# Patient Record
Sex: Male | Born: 1985 | Race: Black or African American | Hispanic: No | Marital: Single | Smoking: Never smoker
Health system: Southern US, Community
[De-identification: ages and names within clinical notes are randomized; demographics above are authoritative.]

## PROBLEM LIST (undated history)

## (undated) HISTORY — PX: APPENDECTOMY: SHX54

---

## 2005-06-02 ENCOUNTER — Encounter: Admission: RE | Admit: 2005-06-02 | Discharge: 2005-06-02 | Payer: Self-pay | Admitting: Emergency Medicine

## 2009-02-08 ENCOUNTER — Emergency Department (HOSPITAL_COMMUNITY): Admission: EM | Admit: 2009-02-08 | Discharge: 2009-02-08 | Payer: Self-pay | Admitting: Family Medicine

## 2009-04-27 ENCOUNTER — Emergency Department (HOSPITAL_COMMUNITY): Admission: EM | Admit: 2009-04-27 | Discharge: 2009-04-27 | Payer: Self-pay | Admitting: Emergency Medicine

## 2009-04-28 ENCOUNTER — Encounter (INDEPENDENT_AMBULATORY_CARE_PROVIDER_SITE_OTHER): Payer: Self-pay | Admitting: Surgery

## 2009-04-28 ENCOUNTER — Inpatient Hospital Stay (HOSPITAL_COMMUNITY): Admission: EM | Admit: 2009-04-28 | Discharge: 2009-05-08 | Payer: Self-pay | Admitting: Emergency Medicine

## 2010-05-13 LAB — BASIC METABOLIC PANEL
BUN: 4 mg/dL — ABNORMAL LOW (ref 6–23)
BUN: 9 mg/dL (ref 6–23)
CO2: 33 mEq/L — ABNORMAL HIGH (ref 19–32)
Chloride: 95 mEq/L — ABNORMAL LOW (ref 96–112)
Chloride: 98 mEq/L (ref 96–112)
GFR calc Af Amer: >60 mL/min (ref >60)
GFR calc Af Amer: >60 mL/min (ref >60)
GFR calc Af Amer: >60 mL/min (ref >60)
GFR calc Af Amer: >60 mL/min (ref >60)
GFR calc Af Amer: >60 mL/min (ref >60)
GFR calc non Af Amer: >60 mL/min (ref >60)
GFR calc non Af Amer: >60 mL/min (ref >60)
GFR calc non Af Amer: >60 mL/min (ref >60)
Glucose, Bld: 106 mg/dL — ABNORMAL HIGH (ref 70–99)
Glucose, Bld: 114 mg/dL — ABNORMAL HIGH (ref 70–99)
Glucose, Bld: 98 mg/dL (ref 70–99)
Potassium: 3.3 mEq/L — ABNORMAL LOW (ref 3.5–5.1)
Potassium: 4.6 mEq/L (ref 3.5–5.1)
Sodium: 130 mEq/L — ABNORMAL LOW (ref 135–145)
Sodium: 130 mEq/L — ABNORMAL LOW (ref 135–145)
Sodium: 134 mEq/L — ABNORMAL LOW (ref 135–145)
Sodium: 134 mEq/L — ABNORMAL LOW (ref 135–145)

## 2010-05-13 LAB — COMPREHENSIVE METABOLIC PANEL
ALT: 20 U/L (ref 0–53)
AST: 30 U/L (ref 0–37)
Albumin: 2.6 g/dL — ABNORMAL LOW (ref 3.5–5.2)
Alkaline Phosphatase: 60 U/L (ref 39–117)
Alkaline Phosphatase: 70 U/L (ref 39–117)
BUN: 15 mg/dL (ref 6–23)
BUN: 7 mg/dL (ref 6–23)
CO2: 24 mEq/L (ref 19–32)
Calcium: 9.1 mg/dL (ref 8.4–10.5)
Chloride: 92 mEq/L — ABNORMAL LOW (ref 96–112)
Chloride: 96 mEq/L (ref 96–112)
Creatinine, Ser: 1.72 mg/dL — ABNORMAL HIGH (ref 0.4–1.5)
GFR calc non Af Amer: 50 mL/min — ABNORMAL LOW (ref >60)
GFR calc non Af Amer: >60 mL/min (ref >60)
Glucose, Bld: 183 mg/dL — ABNORMAL HIGH (ref 70–99)
Sodium: 132 mEq/L — ABNORMAL LOW (ref 135–145)
Total Bilirubin: 2.8 mg/dL — ABNORMAL HIGH (ref 0.3–1.2)
Total Bilirubin: 4.4 mg/dL — ABNORMAL HIGH (ref 0.3–1.2)
Total Protein: 7.9 g/dL (ref 6.0–8.3)

## 2010-05-13 LAB — DIFFERENTIAL
Basophils Absolute: 0.1 10*3/uL (ref 0.0–0.1)
Basophils Relative: 0 % (ref 0–1)
Eosinophils Absolute: 0 10*3/uL (ref 0.0–0.7)
Eosinophils Relative: 0 % (ref 0–5)
Lymphs Abs: 0.7 10*3/uL (ref 0.7–4.0)
Lymphs Abs: 1 10*3/uL (ref 0.7–4.0)
Monocytes Relative: 7 % (ref 3–12)

## 2010-05-13 LAB — URINALYSIS, ROUTINE W REFLEX MICROSCOPIC
Glucose, UA: NEGATIVE mg/dL
Hgb urine dipstick: NEGATIVE
Nitrite: NEGATIVE

## 2010-05-13 LAB — CBC
HCT: 34.3 % — ABNORMAL LOW (ref 39.0–52.0)
HCT: 37.2 % — ABNORMAL LOW (ref 39.0–52.0)
HCT: 39 % (ref 39.0–52.0)
HCT: 40.3 % (ref 39.0–52.0)
Hemoglobin: 11.1 g/dL — ABNORMAL LOW (ref 13.0–17.0)
Hemoglobin: 12.7 g/dL — ABNORMAL LOW (ref 13.0–17.0)
MCHC: 31.3 g/dL (ref 30.0–36.0)
MCHC: 32.4 g/dL (ref 30.0–36.0)
MCV: 73.4 fL — ABNORMAL LOW (ref 78.0–100.0)
MCV: 73.6 fL — ABNORMAL LOW (ref 78.0–100.0)
Platelets: 248 10*3/uL (ref 150–400)
Platelets: 414 10*3/uL — ABNORMAL HIGH (ref 150–400)
RBC: 4.67 MIL/uL (ref 4.22–5.81)
RBC: 4.84 MIL/uL (ref 4.22–5.81)
RBC: 5.02 MIL/uL (ref 4.22–5.81)
RBC: 5.08 MIL/uL (ref 4.22–5.81)
RBC: 5.33 MIL/uL (ref 4.22–5.81)
RDW: 13.7 % (ref 11.5–15.5)
RDW: 13.8 % (ref 11.5–15.5)
RDW: 13.8 % (ref 11.5–15.5)
RDW: 14 % (ref 11.5–15.5)
WBC: 7.4 10*3/uL (ref 4.0–10.5)
WBC: 8 10*3/uL (ref 4.0–10.5)

## 2010-05-13 LAB — ANAEROBIC CULTURE

## 2010-05-13 LAB — BODY FLUID CULTURE

## 2010-12-04 IMAGING — CT CT ABD-PELV W/ CM
2 of 4 series · 14 of 32 positions shown, 19 images · IV contrast (agent unspecified)
Comparison: 04/28/2009

CLINICAL DATA: Diffuse abdominal pain and nausea vomiting.  Postop
appendectomy.

CT ABDOMEN AND PELVIS WITH CONTRAST
TECHNIQUE: Multidetector CT imaging of the abdomen and pelvis was
performed following the standard protocol during bolus
administration of intravenous contrast.
Contrast: 800 ml Amnipaque-COO and oral contrast

[Series 2: routine abdomen · axial · 0.74mm/px · z∈[-397,-2]mm · 8 of 103 slices shown, 13 images]
[im 12/103  soft-tissue]
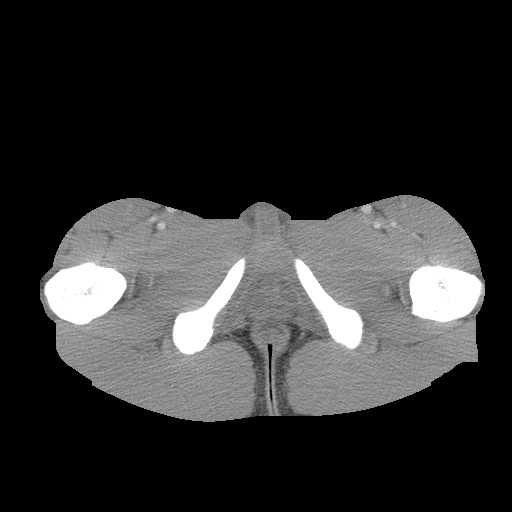
[im 12/103  bone]
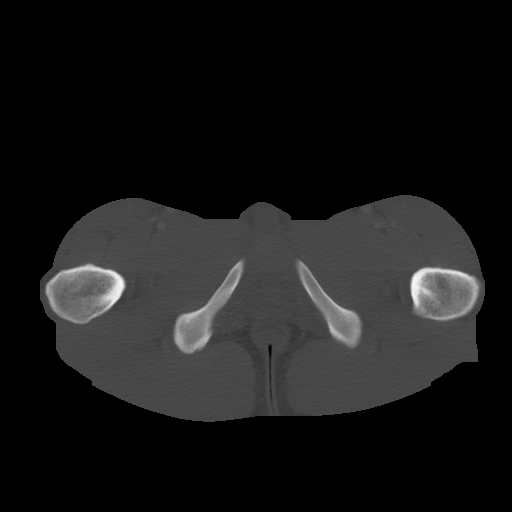
[im 23/103  soft-tissue]
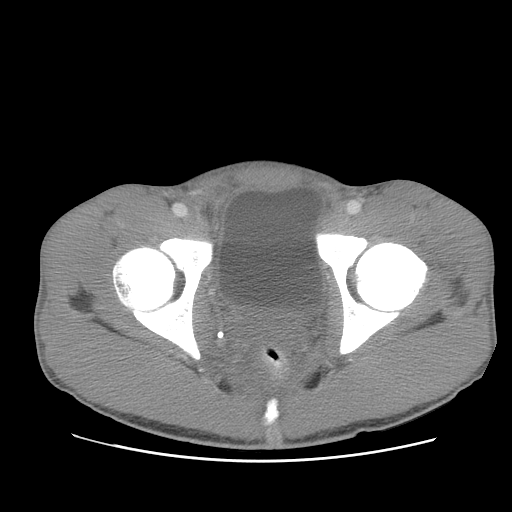
[im 35/103  soft-tissue]
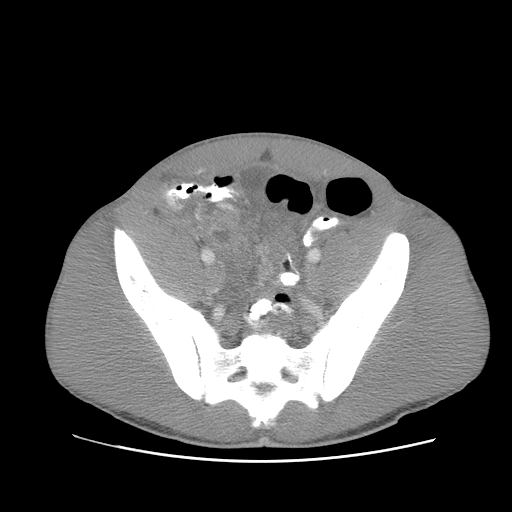
[im 46/103  soft-tissue]
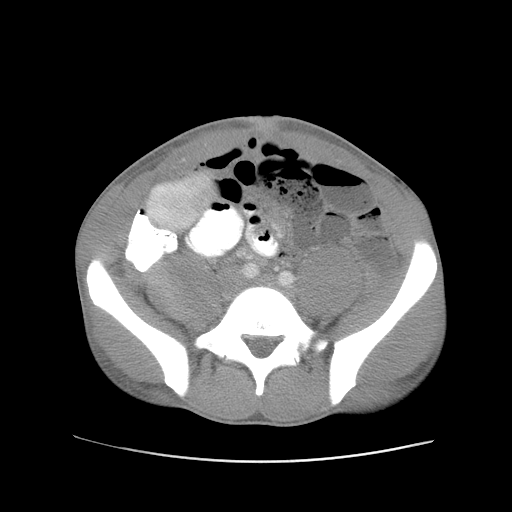
[im 57/103  soft-tissue]
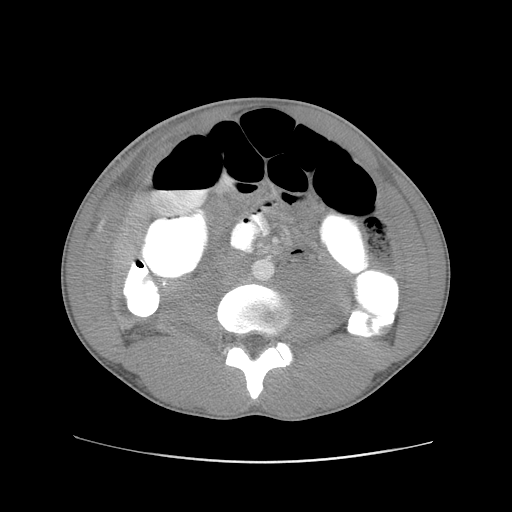
[im 57/103  lung]
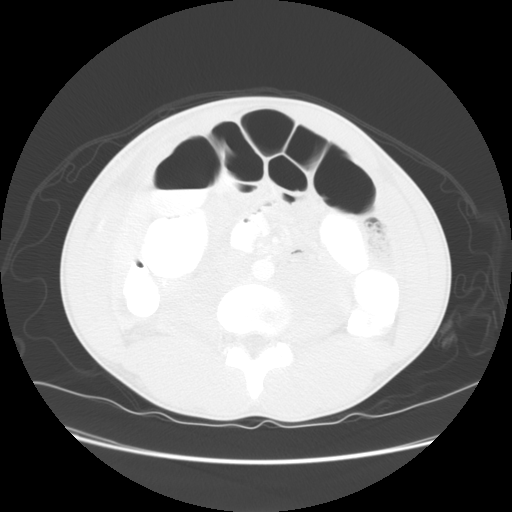
[im 69/103  soft-tissue]
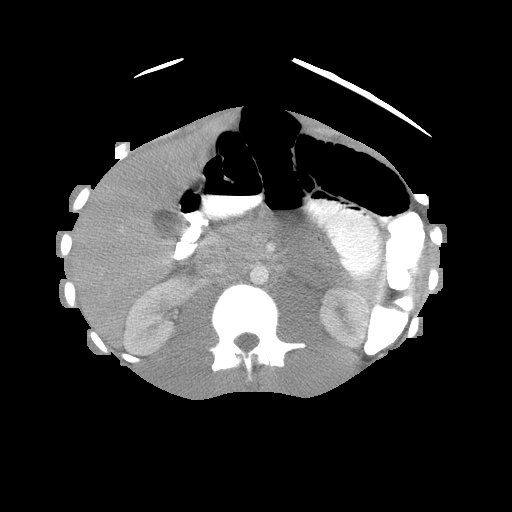
[im 69/103  lung]
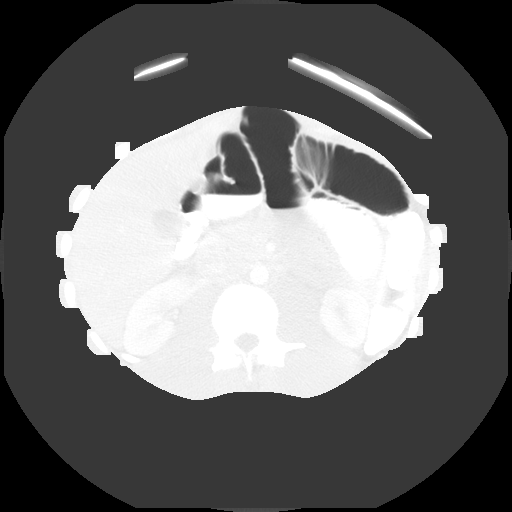
[im 80/103  soft-tissue]
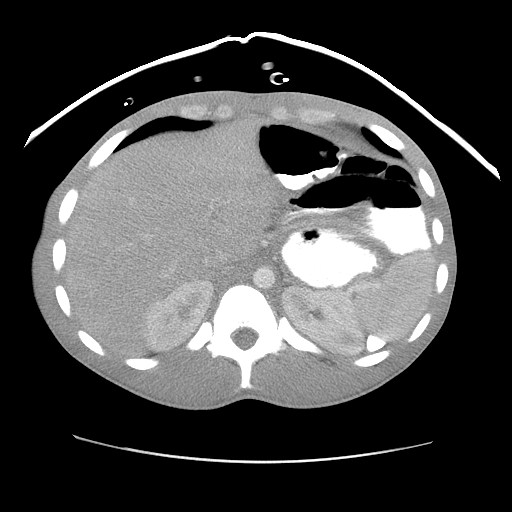
[im 80/103  lung]
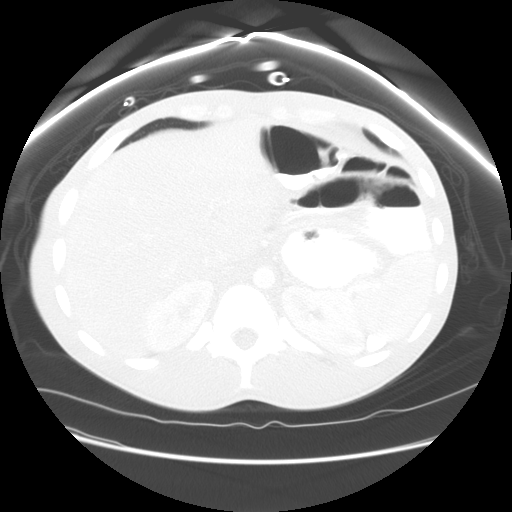
[im 91/103  soft-tissue]
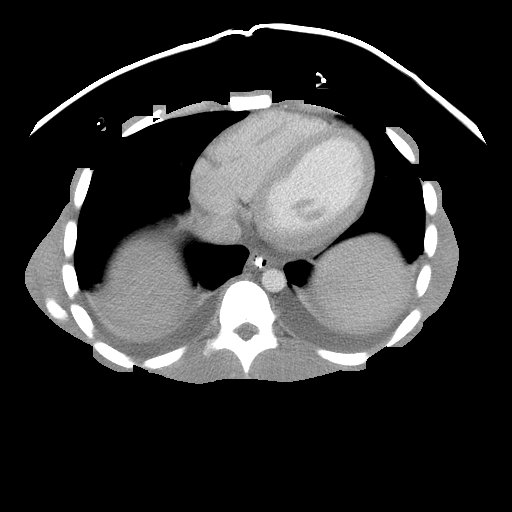
[im 91/103  lung]
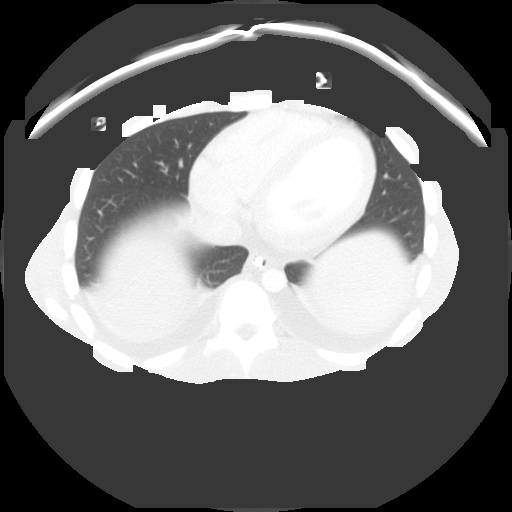

[Series 400: sag · sagittal · 1.02mm/px · 6 of 107 slices shown]
[im 11/107  soft-tissue]
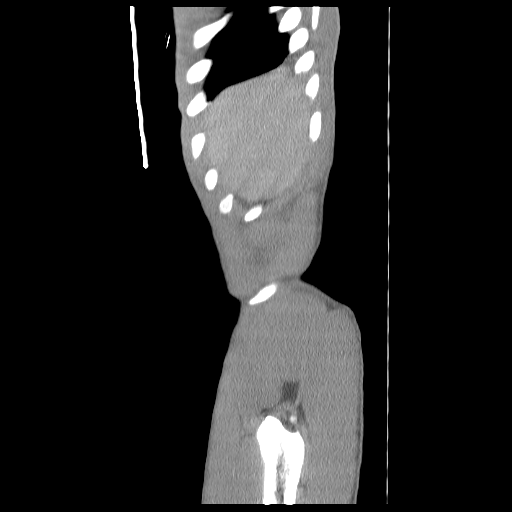
[im 22/107  soft-tissue]
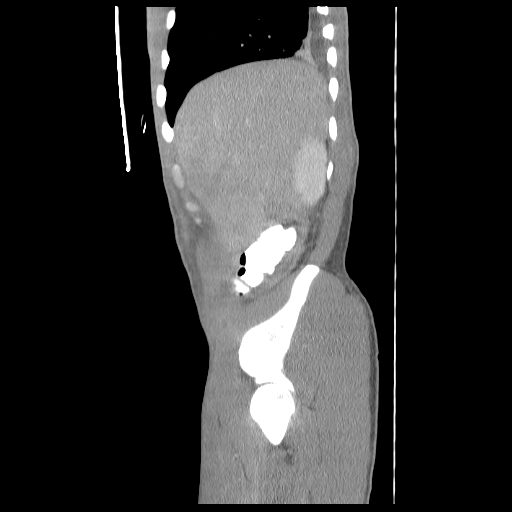
[im 32/107  soft-tissue]
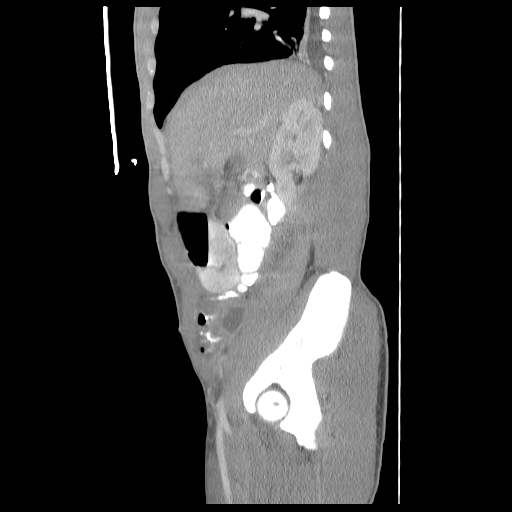
[im 43/107  soft-tissue]
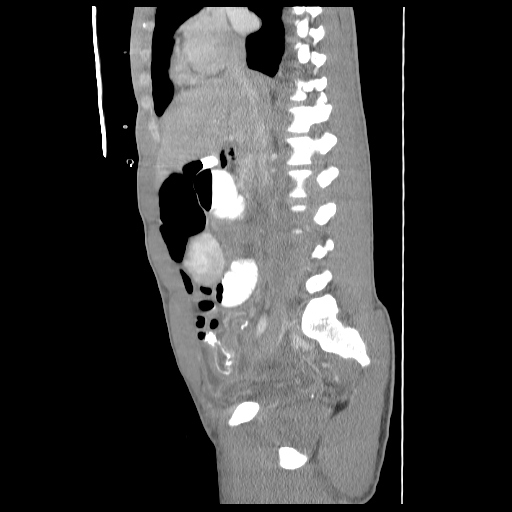
[im 64/107  soft-tissue]
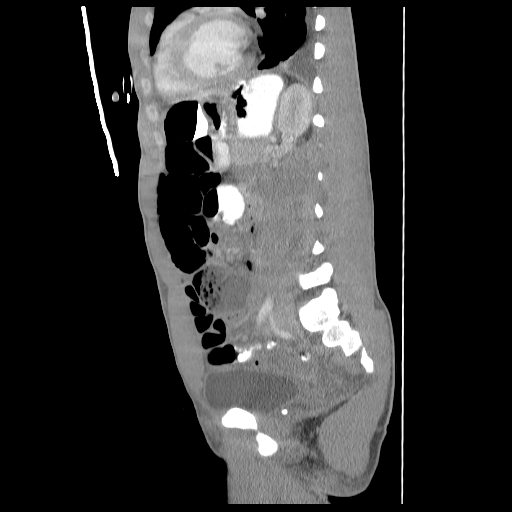
[im 75/107  soft-tissue]
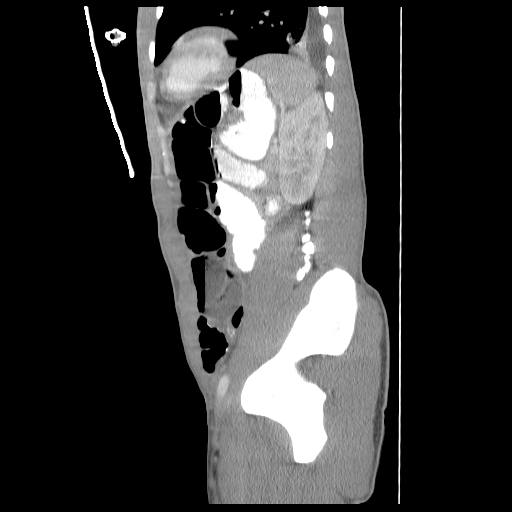

[14 of 32 positions shown; findings below may reference images not displayed]

FINDINGS: New small bilateral pleural effusions are seen.  The
patient has undergone appendectomy since prior study.  Several
small rim enhancing fluid collections are seen in the right lower
quadrant and pelvic cul-de-sac.  The largest of these is located in
the pelvic cul-de-sac and measures 2.4 x 5.0 cm.  This is mildly
decreased since prior study.

Multiple other smaller fluid collections are seen in the right
lower quadrant right pelvis measuring between 1.4 and 3.8 cm in
maximum diameter, also consistent with small abscess sees.  Diffuse
dilatation of bowel loops is again seen consistent with ileus.

The abdominal parenchymal organs are unremarkable in appearance.
No evidence of hydronephrosis.
IMPRESSION: 1.  Multiple small rim enhancing fluid collections in the right
pelvis and pelvic cul-de-sac, consistent with abscesses.  Largest
in the pelvic cul-de-sac measures 5 cm.
2.  Moderate ileus.

## 2014-03-03 ENCOUNTER — Emergency Department (INDEPENDENT_AMBULATORY_CARE_PROVIDER_SITE_OTHER): Payer: BLUE CROSS/BLUE SHIELD

## 2014-03-03 ENCOUNTER — Encounter (HOSPITAL_COMMUNITY): Payer: Self-pay

## 2014-03-03 ENCOUNTER — Emergency Department (HOSPITAL_COMMUNITY)
Admission: EM | Admit: 2014-03-03 | Discharge: 2014-03-03 | Disposition: A | Discharge status: Home / Self Care | Payer: BLUE CROSS/BLUE SHIELD | Source: Home / Self Care | Attending: Emergency Medicine | Admitting: Emergency Medicine

## 2014-03-03 DIAGNOSIS — T1490 Injury, unspecified: Secondary | ICD-10-CM

## 2014-03-03 DIAGNOSIS — S93602A Unspecified sprain of left foot, initial encounter: Secondary | ICD-10-CM

## 2014-03-03 DIAGNOSIS — T1490XA Injury, unspecified, initial encounter: Secondary | ICD-10-CM

## 2014-03-03 NOTE — Discharge Instructions (Signed)
For pain control, you may take the following:  Tylenol 325 mg tablets --2 tablets plus Ibuprofen 200 mg--4 tablets both taken every 8 hours with a meal or snack.   Cryotherapy Cryotherapy is when you put ice on your injury. Ice helps lessen pain and puffiness (swelling) after an injury. Ice works the best when you start using it in the first 24 to 48 hours after an injury. HOME CARE 1. Put a dry or damp towel between the ice pack and your skin. 2. You may press gently on the ice pack. 3. Leave the ice on for no more than 10 to 20 minutes at a time. 4. Check your skin after 5 minutes to make sure your skin is okay. 5. Rest at least 20 minutes between ice pack uses. 6. Stop using ice when your skin loses feeling (numbness). 7. Do not use ice on someone who cannot tell you when it hurts. This includes small children and people with memory problems (dementia). GET HELP RIGHT AWAY IF:  You have white spots on your skin.  Your skin turns blue or pale.  Your skin feels waxy or hard.  Your puffiness gets worse. MAKE SURE YOU:   Understand these instructions.  Will watch your condition.  Will get help right away if you are not doing well or get worse. Document Released: 07/23/2007 Document Revised: 04/28/2011 Document Reviewed: 09/26/2010 [facility] Patient Information 2015 [place], [place]. This information is not intended to replace advice given to you by your health care provider. Make sure you discuss any questions you have with your health care provider.

## 2014-03-03 NOTE — ED Notes (Signed)
States he had an injury to left foot 1 week ago while playing basketball . Pain mostly left 5th metatarsal, proximal aspect. Has been walking on the foot, wrapping w ACE

## 2014-03-03 NOTE — ED Provider Notes (Signed)
   Chief Complaint   Foot Injury   History of Present Illness   Gene Davis is a 29 year old male who was playing basketball week ago when he came down wrong on his left foot. He heard something pop, there is since then he's had pain over the last 3 metatarsals of the foot. There is slight swelling. It hurts to ambulate. He denies any pain over the first or second metatarsals, the ankle, the lower leg.  Review of Systems   Other than as noted above, the patient denies any of the following symptoms: Systemic:  No fevers or chills. Musculoskeletal:  No joint pain or arthritis.  Neurological:  No muscular weakness, paresthesias.   PMFSH   Past medical history, family history, social history, meds, and allergies were reviewed.     Physical  Examination     Vital signs:  BP 127/74 mmHg  Pulse 50  Temp(Src) 98.7 F (37.1 C) (Oral)  Resp 14  SpO2 100% Gen:  Alert and oriented times 3.  In no distress. Musculoskeletal:  Exam of the foot reveals there is mild pain to palpation over the third, fourth, and fifth metatarsals. No swelling or bruising..  Otherwise, all joints had a full a ROM with no swelling, bruising or deformity.  No edema, pulses full. Extremities were warm and pink.  Capillary refill was brisk.  Skin:  Clear, warm and dry.  No rash. Neuro:  Alert and oriented times 3.  Muscle strength was normal.  Sensation was intact to light touch.   Radiology   Dg Foot Complete Left  03/03/2014   CLINICAL DATA:  Left foot pain, injury 1 week ago playing basketball  EXAM: LEFT FOOT - COMPLETE 3+ VIEW  COMPARISON:  Left ankle 06/02/2005  FINDINGS: Three views of the left foot submitted. No acute fracture or subluxation. There is small plantar and posterior spur of calcaneus.  IMPRESSION: No acute fracture or subluxation. Small plantar and posterior spur of calcaneus.   Electronically Signed   By: Natasha MeadLiviu  Pop M.D.   On: 03/03/2014 17:25    I reviewed the images independently and  personally and concur with the radiologist's findings.   Course in Urgent Care Center   The foot was wrapped in Ace wrap was placed and a postoperative boot.  Assessment   The primary encounter diagnosis was Foot sprain, left, initial encounter. A diagnosis of Trauma was also pertinent to this visit.  Plan    1.  Meds:  The following meds were prescribed:  There are no discharge medications for this patient.   2.  Patient Education/Counseling:  The patient was given appropriate handouts, self care instructions, and instructed in symptomatic relief including rest and activity, elevation, application of ice and compression.    3.  Follow up:  The patient was told to follow up here if no better in 3 to 4 days, or sooner if becoming worse in any way, and given some red flag symptoms such as worsening pain or neurological symptoms which would prompt immediate return.  Suggested follow-up with orthopedics if no improvement in 2-3 weeks.     Reuben Likesavid C Ural Acree, MD 03/03/14 [phone]

## 2015-10-03 IMAGING — CR DG FOOT COMPLETE 3+V*L*
3 series · 3 of 3 positions shown · non-contrast
Comparison: Left ankle 06/02/2005

CLINICAL DATA: Left foot pain, injury 1 week ago playing basketball

EXAM:
LEFT FOOT - COMPLETE 3+ VIEW

[foot ap]
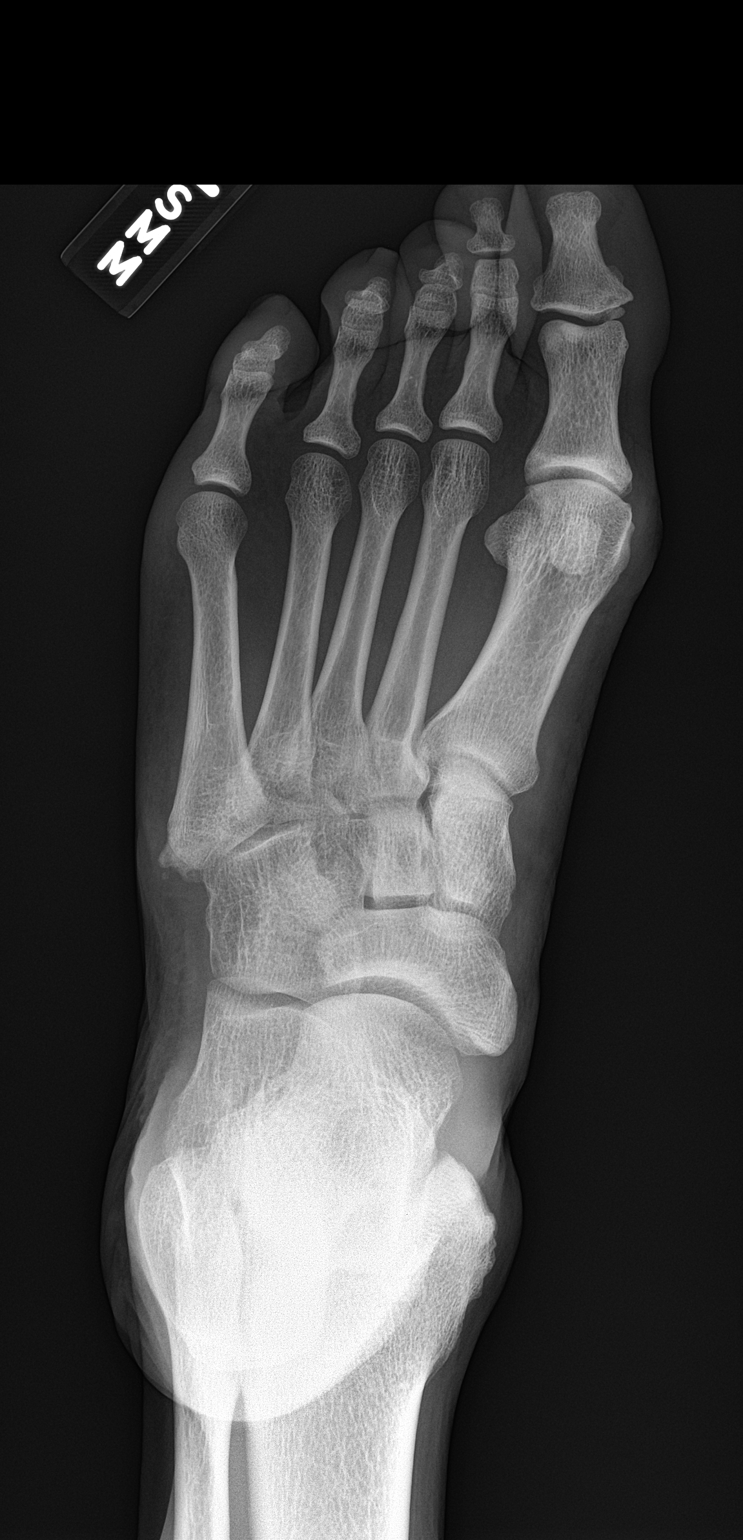

[foot obl]
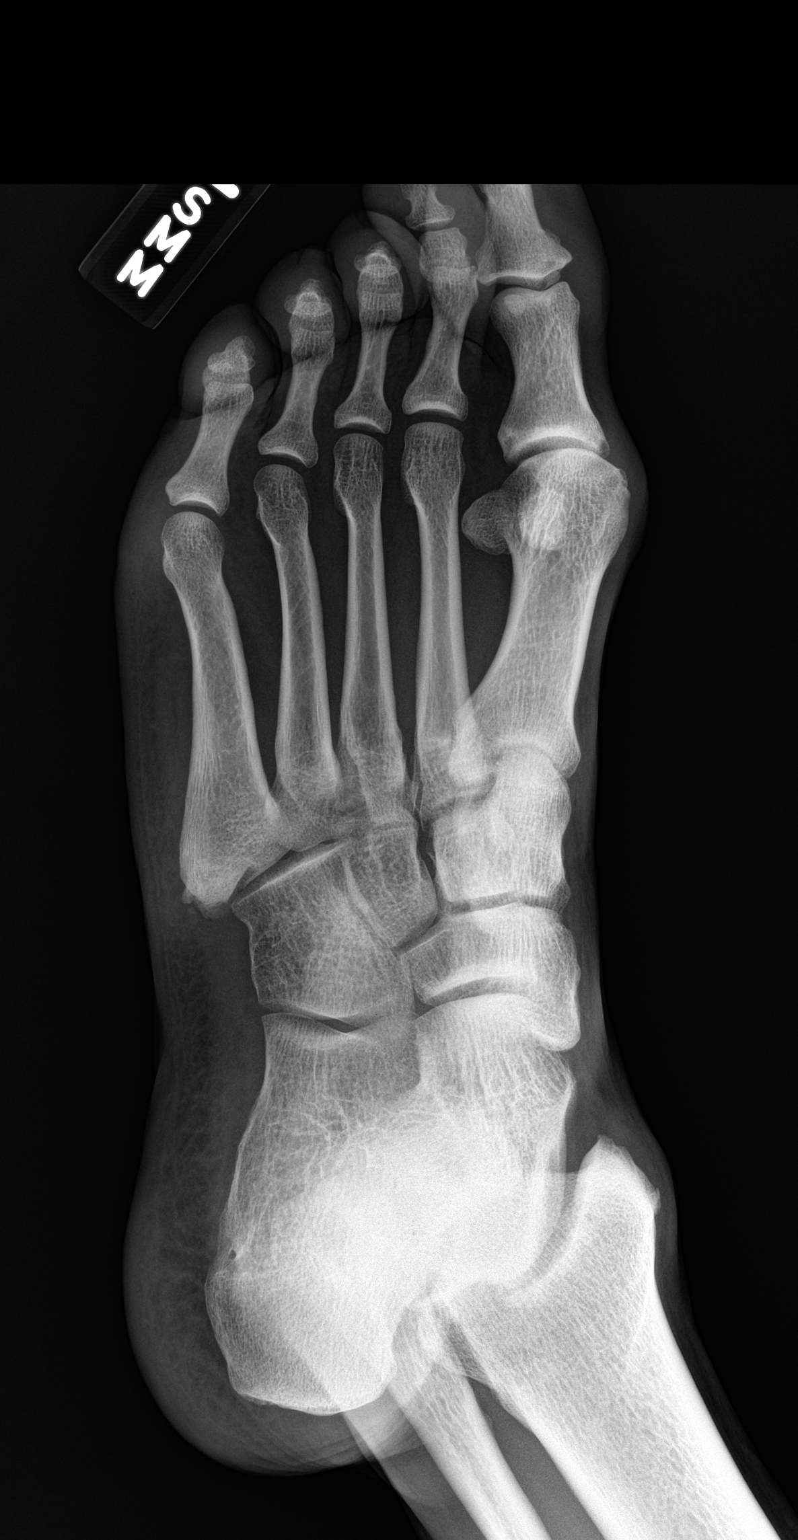

[foot lat]
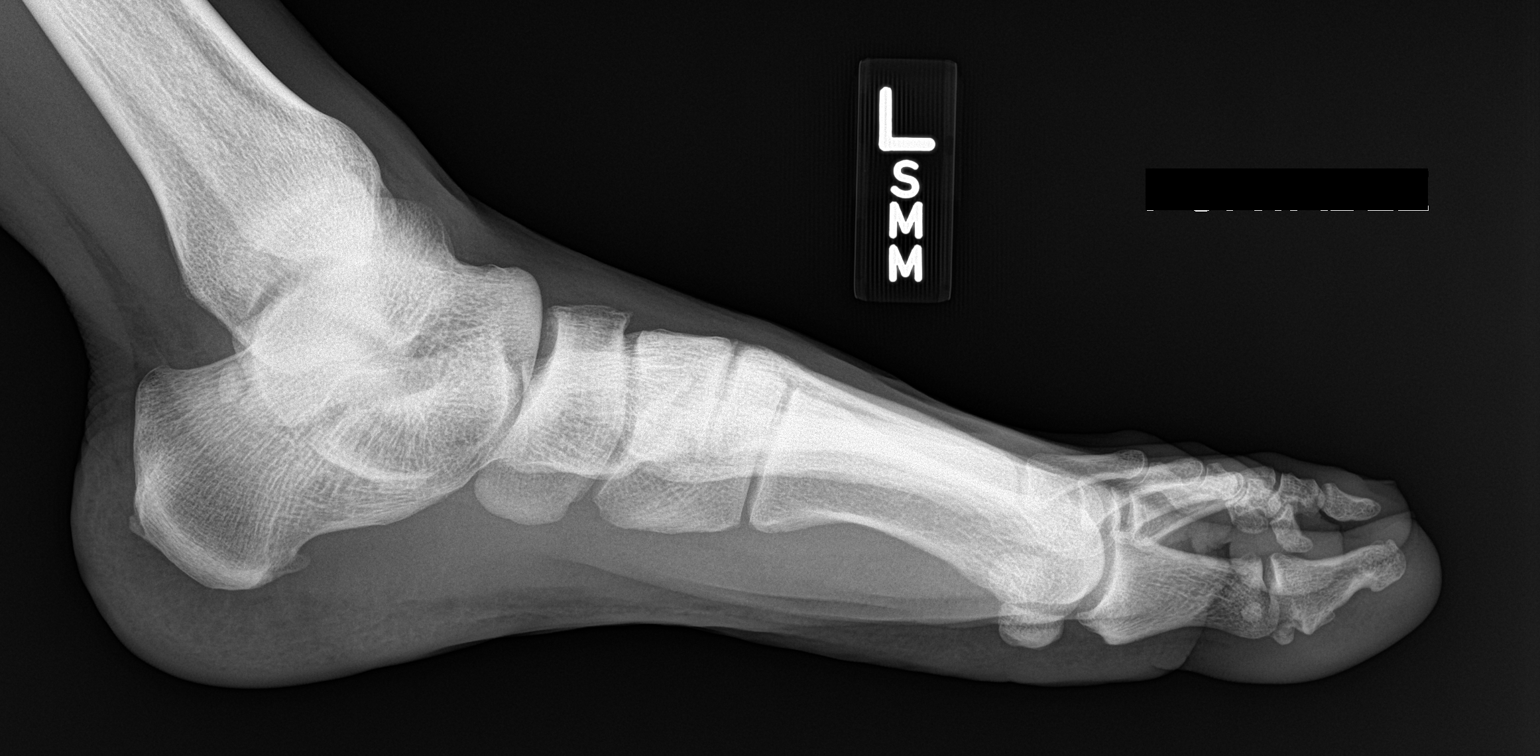

[3 of 3 positions shown; findings below may reference images not displayed]

FINDINGS: Three views of the left foot submitted. No acute fracture or
subluxation. There is small plantar and posterior spur of calcaneus.
IMPRESSION: No acute fracture or subluxation. Small plantar and posterior spur
of calcaneus.

## 2016-07-16 ENCOUNTER — Encounter (HOSPITAL_COMMUNITY): Payer: Self-pay | Admitting: Emergency Medicine

## 2016-07-16 ENCOUNTER — Ambulatory Visit (HOSPITAL_COMMUNITY)
Admission: EM | Admit: 2016-07-16 | Discharge: 2016-07-16 | Disposition: A | Discharge status: Home / Self Care | Payer: BLUE CROSS/BLUE SHIELD | Attending: Internal Medicine | Admitting: Internal Medicine

## 2016-07-16 DIAGNOSIS — J683 Other acute and subacute respiratory conditions due to chemicals, gases, fumes and vapors: Secondary | ICD-10-CM

## 2016-07-16 DIAGNOSIS — Z9109 Other allergy status, other than to drugs and biological substances: Secondary | ICD-10-CM

## 2016-07-16 DIAGNOSIS — R0602 Shortness of breath: Secondary | ICD-10-CM

## 2016-07-16 DIAGNOSIS — G44201 Tension-type headache, unspecified, intractable: Secondary | ICD-10-CM

## 2016-07-16 DIAGNOSIS — J301 Allergic rhinitis due to pollen: Secondary | ICD-10-CM

## 2016-07-16 LAB — POCT I-STAT, CHEM 8
BUN: 7 mg/dL (ref 6–20)
CALCIUM ION: 1.17 mmol/L (ref 1.15–1.40)
CREATININE: 1.1 mg/dL (ref 0.61–1.24)
Chloride: 100 mmol/L — ABNORMAL LOW (ref 101–111)
Glucose, Bld: 85 mg/dL (ref 65–99)
HCT: 48 % (ref 39.0–52.0)
HEMOGLOBIN: 16.3 g/dL (ref 13.0–17.0)
Potassium: 4.2 mmol/L (ref 3.5–5.1)
Sodium: 139 mmol/L (ref 135–145)
TCO2: 30 mmol/L (ref 0–100)

## 2016-07-16 MED ORDER — METHYLPREDNISOLONE 4 MG PO TBPK: ORAL_TABLET | ORAL | 0 refills | Status: AC

## 2016-07-16 MED ORDER — ALBUTEROL SULFATE HFA 108 (90 BASE) MCG/ACT IN AERS: 1.0000 | INHALATION_SPRAY | Freq: Four times a day (QID) | RESPIRATORY_TRACT | 0 refills | Status: AC | PRN

## 2016-07-16 NOTE — ED Provider Notes (Signed)
CSN: [id number]     Arrival date & time 07/16/16  1332 History   None    Chief Complaint  Patient presents with  . Headache   (Consider location/radiation/quality/duration/timing/severity/associated sxs/prior Treatment) Patient c/o headache and fatigue for 3 months.  He c/o having SOB and wheezing recently.  He has hx of exercise induced asthma.   The history is provided by the patient.  Headache  Pain location:  Generalized Quality:  Dull Radiates to:  Does not radiate Severity currently:  6/10 Severity at highest:  6/10 Onset quality:  Sudden Duration:  2 days Timing:  Constant Progression:  Waxing and waning Similar to prior headaches: no   Context: activity   Relieved by:  Nothing Worsened by:  Nothing   History reviewed. No pertinent past medical history. Past Surgical History:  Procedure Laterality Date  . APPENDECTOMY     History reviewed. No pertinent family history. Social History  Substance Use Topics  . Smoking status: Never Smoker  . Smokeless tobacco: Never Used  . Alcohol use Yes     Comment: Socially    Review of Systems  Constitutional: Negative.   HENT: Negative.   Eyes: Negative.   Respiratory: Negative.   Cardiovascular: Negative.   Gastrointestinal: Negative.   Endocrine: Negative.   Genitourinary: Negative.   Musculoskeletal: Negative.   Allergic/Immunologic: Negative.   Neurological: Positive for headaches.  Hematological: Negative.   Psychiatric/Behavioral: Negative.     Allergies  Penicillins  Home Medications   Prior to Admission medications   Medication Sig Start Date End Date Taking? Authorizing Provider  albuterol (PROVENTIL HFA;VENTOLIN HFA) 108 (90 Base) MCG/ACT inhaler Inhale 1-2 puffs into the lungs every 6 (six) hours as needed for wheezing or shortness of breath. 07/16/16   Deatra Canterxford, William J, FNP  methylPREDNISolone (MEDROL DOSEPAK) 4 MG TBPK tablet Take 6-5-4-3-2-1 po qd 07/16/16   Deatra Canterxford, William J, FNP   Meds  Ordered and Administered this Visit  Medications - No data to display  BP 135/81 (BP Location: Left Arm)   Pulse 60   Temp 98.6 F (37 C) (Oral)   Resp 16   SpO2 100%  No data found.   Physical Exam  Constitutional: He appears well-developed and well-nourished.  HENT:  Head: Normocephalic and atraumatic.  Eyes: Conjunctivae and EOM are normal. Pupils are equal, round, and reactive to light.  Neck: Normal range of motion. Neck supple.  Cardiovascular: Normal rate, regular rhythm and normal heart sounds.   Pulmonary/Chest: Effort normal and breath sounds normal.  Abdominal: Soft. Bowel sounds are normal.  Neurological: He is alert.  Nursing note and vitals reviewed.   Urgent Care Course     Procedures (including critical care time)  Labs Review Labs Reviewed  POCT I-STAT, CHEM 8 - Abnormal; Notable for the following:       Result Value   Chloride 100 (*)    All other components within normal limits    Imaging Review No results found.   Visual Acuity Review  Right Eye Distance:   Left Eye Distance:   Bilateral Distance:    Right Eye Near:   Left Eye Near:    Bilateral Near:         MDM   1. Pollen allergies   2. Shortness of breath   3. Acute intractable tension-type headache   4. Reactive airways dysfunction syndrome (HCC)    Albuterol MDI Medrol dose pack as directed 4mg  #21      Deatra Canterxford, William J, FNP  07/16/16 1456  

## 2016-07-16 NOTE — ED Triage Notes (Signed)
[facility] a [facility] and fatigue x 3 months.
